# Patient Record
Sex: Female | Born: 1985 | Race: Asian | Hispanic: No | Marital: Married | Smoking: Never smoker
Health system: Southern US, Community
[De-identification: ages and names within clinical notes are randomized; demographics above are authoritative.]

## PROBLEM LIST (undated history)

## (undated) ENCOUNTER — Inpatient Hospital Stay (HOSPITAL_COMMUNITY): Payer: Self-pay

## (undated) DIAGNOSIS — Z789 Other specified health status: Secondary | ICD-10-CM

## (undated) HISTORY — PX: NO PAST SURGERIES: SHX2092

---

## 2013-06-07 LAB — OB RESULTS CONSOLE ABO/RH: RH Type: POSITIVE

## 2013-06-07 LAB — OB RESULTS CONSOLE HIV ANTIBODY (ROUTINE TESTING): HIV: NONREACTIVE

## 2013-06-07 LAB — OB RESULTS CONSOLE GC/CHLAMYDIA
Chlamydia: NEGATIVE
Gonorrhea: NEGATIVE

## 2013-06-07 LAB — OB RESULTS CONSOLE RUBELLA ANTIBODY, IGM: Rubella: IMMUNE

## 2013-06-07 LAB — OB RESULTS CONSOLE RPR: RPR: NONREACTIVE

## 2013-12-04 ENCOUNTER — Observation Stay (HOSPITAL_COMMUNITY)
Admission: AD | Admit: 2013-12-04 | Discharge: 2013-12-05 | Disposition: A | Discharge status: Home / Self Care | Payer: Medicaid Other | Source: Ambulatory Visit | Attending: Obstetrics and Gynecology | Admitting: Obstetrics and Gynecology

## 2013-12-04 ENCOUNTER — Encounter (HOSPITAL_COMMUNITY): Payer: Self-pay | Admitting: *Deleted

## 2013-12-04 DIAGNOSIS — R059 Cough, unspecified: Secondary | ICD-10-CM | POA: Insufficient documentation

## 2013-12-04 DIAGNOSIS — J029 Acute pharyngitis, unspecified: Secondary | ICD-10-CM | POA: Insufficient documentation

## 2013-12-04 DIAGNOSIS — O4700 False labor before 37 completed weeks of gestation, unspecified trimester: Secondary | ICD-10-CM | POA: Insufficient documentation

## 2013-12-04 DIAGNOSIS — O99891 Other specified diseases and conditions complicating pregnancy: Secondary | ICD-10-CM | POA: Insufficient documentation

## 2013-12-04 DIAGNOSIS — IMO0002 Reserved for concepts with insufficient information to code with codable children: Secondary | ICD-10-CM

## 2013-12-04 DIAGNOSIS — R6889 Other general symptoms and signs: Secondary | ICD-10-CM | POA: Insufficient documentation

## 2013-12-04 DIAGNOSIS — O479 False labor, unspecified: Secondary | ICD-10-CM

## 2013-12-04 DIAGNOSIS — O368390 Maternal care for abnormalities of the fetal heart rate or rhythm, unspecified trimester, not applicable or unspecified: Principal | ICD-10-CM | POA: Insufficient documentation

## 2013-12-04 DIAGNOSIS — R05 Cough: Secondary | ICD-10-CM | POA: Insufficient documentation

## 2013-12-04 HISTORY — DX: Other specified health status: Z78.9

## 2013-12-04 LAB — URINALYSIS, ROUTINE W REFLEX MICROSCOPIC
Glucose, UA: NEGATIVE mg/dL
Ketones, ur: NEGATIVE mg/dL
Leukocytes, UA: NEGATIVE
pH: 6.5 (ref 5.0–8.0)

## 2013-12-04 MED ORDER — LACTATED RINGERS IV SOLN
INTRAVENOUS | Status: DC
  Administered 2013-12-04 – 2013-12-05 (×3): via INTRAVENOUS

## 2013-12-04 MED ORDER — LACTATED RINGERS IV BOLUS (SEPSIS)
1000.0000 mL | Freq: Once | INTRAVENOUS | Status: AC
  Administered 2013-12-04: 1000 mL via INTRAVENOUS

## 2013-12-04 NOTE — MAU Note (Signed)
Pt started this morning with vomiting X1, fever 102.2, sore throat, slight headache and dry cough. Denies any vag bleeding or leaking. Reports fetal movement.

## 2013-12-04 NOTE — MAU Provider Note (Signed)
History     CSN: [id number]  Arrival date and time: 12/04/13 2006   First Provider Initiated Contact with Patient 12/04/13 2100      No chief complaint on file.  HPI Ms. Amy Morse is a 27 y.o. G1P0 at [redacted]w[redacted]d who presents to MAU today with flu-like symptoms. The patient states she has had very mild sore throat, cough and headache today. She also notes a temperature of 102.1F at home earlier today. She does endorse some weakness and contractions at this time. She denies body aches or sinus congestion. She also notes less fetal movement than usual today.   OB History   Grav Para Term Preterm Abortions TAB SAB Ect Mult Living   1         0      Past Medical History  Diagnosis Date  . Medical history non-contributory     Past Surgical History  Procedure Laterality Date  . No past surgeries      Family History  Problem Relation Age of Onset  . Depression Mother   . Hypertension Father   . Diabetes Father     History  Substance Use Topics  . Smoking status: Never Smoker   . Smokeless tobacco: Not on file  . Alcohol Use: No    Allergies: No Known Allergies  Prescriptions prior to admission  Medication Sig Dispense Refill  . Prenatal Vit-Fe Fumarate-FA (PRENATAL MULTIVITAMIN) TABS tablet Take 1 tablet by mouth daily at 12 noon.        Review of Systems  Constitutional: Positive for fever and malaise/fatigue.  Gastrointestinal: Positive for nausea, vomiting and abdominal pain.   Physical Exam   Blood pressure 108/61, pulse 118, temperature 98.8 F (37.1 C), temperature source Oral, resp. rate 16, height 5\' 1"  (1.549 m), weight 130 lb (58.968 kg), last menstrual period 03/25/2013.  Physical Exam  Constitutional: She is oriented to person, place, and time. She appears well-developed and well-nourished. No distress.  HENT:  Head: Normocephalic and atraumatic.  Cardiovascular: Normal rate, regular rhythm and normal heart sounds.   Respiratory: Effort normal and  breath sounds normal. No respiratory distress. She has no wheezes.  GI: Soft. She exhibits no distension and no mass. There is no tenderness. There is no rebound and no guarding.  Neurological: She is alert and oriented to person, place, and time.  Skin: Skin is warm and dry. No erythema.  Psychiatric: She has a normal mood and affect.  Dilation: 2.5 Effacement (%): 80 Cervical Position: Posterior Station: -3 Presentation: Vertex Exam by:: Elie Confer RN  Results for orders placed during the hospital encounter of 12/04/13 (from the past 24 hour(s))  URINALYSIS, ROUTINE W REFLEX MICROSCOPIC     Status: None   Collection Time    12/04/13  8:35 PM      Result Value Range   Color, Urine YELLOW  YELLOW   APPearance CLEAR  CLEAR   Specific Gravity, Urine 1.020  1.005 - 1.030   pH 6.5  5.0 - 8.0   Glucose, UA NEGATIVE  NEGATIVE mg/dL   Hgb urine dipstick NEGATIVE  NEGATIVE   Bilirubin Urine NEGATIVE  NEGATIVE   Ketones, ur NEGATIVE  NEGATIVE mg/dL   Protein, ur NEGATIVE  NEGATIVE mg/dL   Urobilinogen, UA 0.2  0.0 - 1.0 mg/dL   Nitrite NEGATIVE  NEGATIVE   Leukocytes, UA NEGATIVE  NEGATIVE    Fetal Monitoring: Baseline: 145 bpm, moderate variability, + accelerations, no decelerations Contractions: q 2-3 minutes, mild MAU  Course  Procedures None  MDM Discussed patient with Dr. Ellyn Hack including fetal tachycardia, contractions and variable decelerations Continue to monitor in MAU x 1 hour and update Dr. Ellyn Hack. Fetal tachycardia continues with occasional variables. Cervix has changed from 1.5/60/-3 to 2.5/80/-3 Discussed with Dr. Ellyn Hack. Admit to Antenatal for 23 hour observation with continuous monitoring, clear liquid diet and recheck cervix in 1 hour GBS unknown. If patient's cervix changes, RN instructed to call Dr. Ellyn Hack for antibiotic orders because of unknown GBS status.   Assessment and Plan  A: Uterine contractions Fetal tachycardia  P: Admit to Antenatal under  observation status for additional monitoring  Freddi Starr, PA-C  12/04/2013, 9:00 PM

## 2013-12-05 ENCOUNTER — Observation Stay (HOSPITAL_COMMUNITY): Payer: Medicaid Other

## 2013-12-05 ENCOUNTER — Encounter (HOSPITAL_COMMUNITY): Payer: Self-pay

## 2013-12-05 DIAGNOSIS — IMO0002 Reserved for concepts with insufficient information to code with codable children: Secondary | ICD-10-CM | POA: Diagnosis present

## 2013-12-05 MED ORDER — DOCUSATE SODIUM 100 MG PO CAPS: 100.0000 mg | ORAL_CAPSULE | Freq: Every day | ORAL | Status: DC

## 2013-12-05 MED ORDER — ACETAMINOPHEN 325 MG PO TABS: 650.0000 mg | ORAL_TABLET | ORAL | Status: DC | PRN

## 2013-12-05 MED ORDER — PRENATAL MULTIVITAMIN CH: 1.0000 | ORAL_TABLET | Freq: Every day | ORAL | Status: DC

## 2013-12-05 MED ORDER — MENTHOL 3 MG MT LOZG
1.0000 | LOZENGE | OROMUCOSAL | Status: DC | PRN
Start: 2013-12-05 — End: 2013-12-05
  Administered 2013-12-05: 3 mg via ORAL
  Filled 2013-12-05: qty 9

## 2013-12-05 MED ORDER — CALCIUM CARBONATE ANTACID 500 MG PO CHEW: 2.0000 | CHEWABLE_TABLET | ORAL | Status: DC | PRN

## 2013-12-05 MED ORDER — ZOLPIDEM TARTRATE 5 MG PO TABS
5.0000 mg | ORAL_TABLET | Freq: Every evening | ORAL | Status: DC | PRN
  Administered 2013-12-05: 5 mg via ORAL
  Filled 2013-12-05: qty 1

## 2013-12-05 MED ORDER — ACETAMINOPHEN 500 MG PO TABS
1000.0000 mg | ORAL_TABLET | Freq: Four times a day (QID) | ORAL | Status: AC | PRN
  Administered 2013-12-05: 1000 mg via ORAL
  Filled 2013-12-05: qty 2

## 2013-12-05 NOTE — Discharge Summary (Signed)
Obstetric Discharge Summary Reason for Admission: cervical change, fetal tachycardia Prenatal Procedures: NST and ultrasound Intrapartum Procedures: N/A Postpartum Procedures: none Complications-Operative and Postpartum: none No results found for this basename: hgb, hct    Physical Exam:  General: alert and no distress   Discharge Diagnoses: Term pregnancy  Discharge Information: Date: 12/05/2013 Activity: unrestricted Diet: routine Medications: PNV Condition: stable Instructions: refer to practice specific booklet Discharge to: home Follow-up Information   Follow up with BOVARD,Jadeyn Hargett, MD In 3 days. (As scheduled)    Specialty:  Obstetrics and Gynecology   Contact information:   [address] [place] [address] [phone]        BOVARD,Donnalyn Juran 12/05/2013, 7:47 AM

## 2013-12-05 NOTE — H&P (Signed)
  Pt admitted after presentation to MAU with questionable cervical change.  Pt presented with ? Flu sx's.  Temperature at home = 102.2, on recheck 98.  Some vague sx's.  Sick contact is her mother.  Moother had rapid flu negative.  Please see NP note for complete H&P

## 2013-12-05 NOTE — Progress Notes (Signed)
Patient ID: Amy Morse, female   DOB: 1986/04/01, 27 y.o.   MRN: [id number]  Feeling "sick" - has cough and runny nose.  Recent Tm = 100.5.  Not feeling baby move as well.  No LOF, no VB, occ ctx, some are uncomfortable.  Tm = 100.5 VSS gen NAD FHTs 150's, category 1, occ late decel toco q 4-68min  SVE unchanged per RN  Will obtain BPP and d/c to home.  To f/u this week for appt as scheduled.  Given OTC med and rest advice.

## 2013-12-06 ENCOUNTER — Inpatient Hospital Stay (HOSPITAL_COMMUNITY)
Admission: AD | Admit: 2013-12-06 | Discharge: 2013-12-06 | Disposition: A | Discharge status: Home / Self Care | Payer: Medicaid Other | Source: Ambulatory Visit | Attending: Obstetrics and Gynecology | Admitting: Obstetrics and Gynecology

## 2013-12-06 DIAGNOSIS — R059 Cough, unspecified: Secondary | ICD-10-CM | POA: Insufficient documentation

## 2013-12-06 DIAGNOSIS — R509 Fever, unspecified: Secondary | ICD-10-CM | POA: Insufficient documentation

## 2013-12-06 DIAGNOSIS — O368390 Maternal care for abnormalities of the fetal heart rate or rhythm, unspecified trimester, not applicable or unspecified: Secondary | ICD-10-CM

## 2013-12-06 DIAGNOSIS — R05 Cough: Secondary | ICD-10-CM | POA: Insufficient documentation

## 2013-12-06 DIAGNOSIS — J111 Influenza due to unidentified influenza virus with other respiratory manifestations: Secondary | ICD-10-CM | POA: Insufficient documentation

## 2013-12-06 DIAGNOSIS — O4700 False labor before 37 completed weeks of gestation, unspecified trimester: Secondary | ICD-10-CM | POA: Insufficient documentation

## 2013-12-06 DIAGNOSIS — J101 Influenza due to other identified influenza virus with other respiratory manifestations: Secondary | ICD-10-CM

## 2013-12-06 DIAGNOSIS — O99891 Other specified diseases and conditions complicating pregnancy: Secondary | ICD-10-CM | POA: Insufficient documentation

## 2013-12-06 LAB — INFLUENZA PANEL BY PCR (TYPE A & B)
H1N1 flu by pcr: NOT DETECTED
Influenza A By PCR: POSITIVE — AB
Influenza B By PCR: NEGATIVE

## 2013-12-06 LAB — OB RESULTS CONSOLE GBS: GBS: NEGATIVE

## 2013-12-06 MED ORDER — ACETAMINOPHEN 500 MG PO TABS
1000.0000 mg | ORAL_TABLET | Freq: Once | ORAL | Status: DC
  Filled 2013-12-06: qty 2

## 2013-12-06 MED ORDER — OSELTAMIVIR PHOSPHATE 75 MG PO CAPS: 75.0000 mg | ORAL_CAPSULE | Freq: Two times a day (BID) | ORAL | Status: DC

## 2013-12-06 MED ORDER — OSELTAMIVIR PHOSPHATE 75 MG PO CAPS
75.0000 mg | ORAL_CAPSULE | Freq: Once | ORAL | Status: AC
  Administered 2013-12-06: 75 mg via ORAL
  Filled 2013-12-06: qty 1

## 2013-12-06 NOTE — MAU Note (Signed)
Flu test obtained

## 2013-12-06 NOTE — MAU Provider Note (Signed)
  History     CSN: [id number]  Arrival date and time: 12/06/13 6962   First Provider Initiated Contact with Patient 12/06/13 [phone]      Chief Complaint  Patient presents with  . Influenza   HPI 27 y.o. G1P0 at [redacted]w[redacted]d with cough, sore throat, intermittent fever x 2 days. Was admitted for observation overnight on 12/29 d/t decreased fetal movement, BPP 8/8 at that time, was sent home with instructions for OTC meds and rest. Has been taking tylenol and "cough medicine" at home, still not feeling well. States temp was 100.5 last night.   Past Medical History  Diagnosis Date  . Medical history non-contributory     Past Surgical History  Procedure Laterality Date  . No past surgeries      Family History  Problem Relation Age of Onset  . Depression Mother   . Hypertension Father   . Diabetes Father     History  Substance Use Topics  . Smoking status: Never Smoker   . Smokeless tobacco: Never Used  . Alcohol Use: No    Allergies: No Known Allergies  Prescriptions prior to admission  Medication Sig Dispense Refill  . Prenatal Vit-Fe Fumarate-FA (PRENATAL MULTIVITAMIN) TABS tablet Take 1 tablet by mouth daily at 12 noon.        Review of Systems  Constitutional: Positive for fever, chills and malaise/fatigue.  HENT: Positive for sore throat.   Respiratory: Positive for cough.   Cardiovascular: Negative.   Gastrointestinal: Negative for nausea, vomiting, abdominal pain, diarrhea and constipation.  Genitourinary: Negative for dysuria, urgency, frequency, hematuria and flank pain.       Negative for vaginal bleeding, cramping/contractions  Musculoskeletal: Negative.   Neurological: Negative.   Psychiatric/Behavioral: Negative.    Physical Exam   Blood pressure 110/74, pulse 111, temperature 98.4 F (36.9 C), temperature source Oral, resp. rate 20, last menstrual period 03/25/2013.  Physical Exam  Nursing note and vitals reviewed. Constitutional: She is oriented to  person, place, and time. She appears well-developed and well-nourished. No distress.  Neck: Normal range of motion. Neck supple.  Cardiovascular: Normal rate, regular rhythm and normal heart sounds.   Respiratory: Effort normal and breath sounds normal. No respiratory distress.  GI: Soft. There is no tenderness.  Lymphadenopathy:    She has no cervical adenopathy.  Neurological: She is alert and oriented to person, place, and time.  Skin: Skin is warm and dry.  Psychiatric: She has a normal mood and affect.   EFM reactive, irreg UCs MAU Course  Procedures  Flu PCR collected In view of pt's sx of flu for 2 days- Tamiflu ordered Discussed with Dr. Peterson Lombard had reactive NST but had  variable on FHR @8 :45 am- will watch for another 15 min to equal another hr of observation- if no variables, Pt may go home. Pt had recent BPP 8/8 Results for orders placed during the hospital encounter of 12/06/13 (from the past 24 hour(s))  INFLUENZA PANEL BY PCR     Status: Abnormal   Collection Time    12/06/13  7:30 AM      Result Value Range   Influenza A By PCR POSITIVE (*) NEGATIVE   Influenza B By PCR NEGATIVE  NEGATIVE   H1N1 flu by pcr NOT DETECTED  NOT DETECTED   Assessment and Plan  Care assumed by Pamelia Hoit, NP  Influenza A in pregnancy- Tamiflu BIDfor 5 days F/u with Dr. Dell Ponto 12/06/2013, 7:48 AM

## 2013-12-27 ENCOUNTER — Encounter (HOSPITAL_COMMUNITY): Payer: Medicaid Other | Admitting: Anesthesiology

## 2013-12-27 ENCOUNTER — Inpatient Hospital Stay (HOSPITAL_COMMUNITY)
Admission: AD | Admit: 2013-12-27 | Discharge: 2013-12-29 | DRG: 775 | Disposition: A | Discharge status: Home / Self Care | Payer: Medicaid Other | Source: Ambulatory Visit | Attending: Obstetrics and Gynecology | Admitting: Obstetrics and Gynecology

## 2013-12-27 ENCOUNTER — Encounter (HOSPITAL_COMMUNITY): Payer: Self-pay | Admitting: *Deleted

## 2013-12-27 ENCOUNTER — Inpatient Hospital Stay (HOSPITAL_COMMUNITY): Payer: Medicaid Other | Admitting: Anesthesiology

## 2013-12-27 DIAGNOSIS — O4290 Premature rupture of membranes, unspecified as to length of time between rupture and onset of labor, unspecified weeks of gestation: Secondary | ICD-10-CM

## 2013-12-27 LAB — CBC
HEMATOCRIT: 34.4 % — AB (ref 36.0–46.0)
Hemoglobin: 11.7 g/dL — ABNORMAL LOW (ref 12.0–15.0)
MCH: 29.8 pg (ref 26.0–34.0)
MCHC: 34 g/dL (ref 30.0–36.0)
MCV: 87.8 fL (ref 78.0–100.0)
PLATELETS: 197 10*3/uL (ref 150–400)
RBC: 3.92 MIL/uL (ref 3.87–5.11)
RDW: 13.3 % (ref 11.5–15.5)
WBC: 16.8 10*3/uL — AB (ref 4.0–10.5)

## 2013-12-27 LAB — TYPE AND SCREEN
ABO/RH(D): O POS
Antibody Screen: NEGATIVE

## 2013-12-27 LAB — RPR: RPR Ser Ql: NONREACTIVE

## 2013-12-27 LAB — ABO/RH: ABO/RH(D): O POS

## 2013-12-27 LAB — POCT FERN TEST: POCT Fern Test: POSITIVE

## 2013-12-27 MED ORDER — LACTATED RINGERS IV SOLN
500.0000 mL | INTRAVENOUS | Status: DC | PRN
  Administered 2013-12-27 (×3): 500 mL via INTRAVENOUS

## 2013-12-27 MED ORDER — IBUPROFEN 600 MG PO TABS
600.0000 mg | ORAL_TABLET | Freq: Four times a day (QID) | ORAL | Status: DC | PRN
Start: 2013-12-27 — End: 2013-12-27

## 2013-12-27 MED ORDER — FENTANYL 2.5 MCG/ML BUPIVACAINE 1/10 % EPIDURAL INFUSION (WH - ANES)
14.0000 mL/h | INTRAMUSCULAR | Status: DC | PRN
  Filled 2013-12-27: qty 125

## 2013-12-27 MED ORDER — LACTATED RINGERS IV SOLN: INTRAVENOUS | Status: AC

## 2013-12-27 MED ORDER — PRENATAL MULTIVITAMIN CH: 1.0000 | ORAL_TABLET | Freq: Every day | ORAL | Status: DC

## 2013-12-27 MED ORDER — OXYTOCIN 40 UNITS IN LACTATED RINGERS INFUSION - SIMPLE MED
1.0000 m[IU]/min | INTRAVENOUS | Status: DC
  Administered 2013-12-27: 1 m[IU]/min via INTRAVENOUS
  Filled 2013-12-27: qty 1000

## 2013-12-27 MED ORDER — LACTATED RINGERS IV SOLN
INTRAVENOUS | Status: DC
  Administered 2013-12-27 (×3): via INTRAVENOUS

## 2013-12-27 MED ORDER — WITCH HAZEL-GLYCERIN EX PADS: 1.0000 "application " | MEDICATED_PAD | CUTANEOUS | Status: DC | PRN

## 2013-12-27 MED ORDER — OXYTOCIN 40 UNITS IN LACTATED RINGERS INFUSION - SIMPLE MED
62.5000 mL/h | INTRAVENOUS | Status: DC
  Administered 2013-12-27: 62.5 mL/h via INTRAVENOUS

## 2013-12-27 MED ORDER — TETANUS-DIPHTH-ACELL PERTUSSIS 5-2.5-18.5 LF-MCG/0.5 IM SUSP: 0.5000 mL | Freq: Once | INTRAMUSCULAR | Status: DC

## 2013-12-27 MED ORDER — OXYCODONE-ACETAMINOPHEN 5-325 MG PO TABS: 1.0000 | ORAL_TABLET | ORAL | Status: DC | PRN

## 2013-12-27 MED ORDER — ZOLPIDEM TARTRATE 5 MG PO TABS: 5.0000 mg | ORAL_TABLET | Freq: Every evening | ORAL | Status: DC | PRN

## 2013-12-27 MED ORDER — CITRIC ACID-SODIUM CITRATE 334-500 MG/5ML PO SOLN: 30.0000 mL | ORAL | Status: DC | PRN

## 2013-12-27 MED ORDER — LANOLIN HYDROUS EX OINT: TOPICAL_OINTMENT | CUTANEOUS | Status: DC | PRN

## 2013-12-27 MED ORDER — DIBUCAINE 1 % RE OINT: 1.0000 "application " | TOPICAL_OINTMENT | RECTAL | Status: DC | PRN

## 2013-12-27 MED ORDER — OXYTOCIN 40 UNITS IN LACTATED RINGERS INFUSION - SIMPLE MED: 62.5000 mL/h | INTRAVENOUS | Status: AC | PRN

## 2013-12-27 MED ORDER — IBUPROFEN 600 MG PO TABS
600.0000 mg | ORAL_TABLET | Freq: Four times a day (QID) | ORAL | Status: DC
  Administered 2013-12-28 – 2013-12-29 (×6): 600 mg via ORAL
  Filled 2013-12-27 (×6): qty 1

## 2013-12-27 MED ORDER — ONDANSETRON HCL 4 MG/2ML IJ SOLN: 4.0000 mg | Freq: Four times a day (QID) | INTRAMUSCULAR | Status: DC | PRN

## 2013-12-27 MED ORDER — DIPHENHYDRAMINE HCL 25 MG PO CAPS: 25.0000 mg | ORAL_CAPSULE | Freq: Four times a day (QID) | ORAL | Status: DC | PRN

## 2013-12-27 MED ORDER — LIDOCAINE HCL (PF) 1 % IJ SOLN
INTRAMUSCULAR | Status: DC | PRN
  Administered 2013-12-27 (×2): 4 mL

## 2013-12-27 MED ORDER — ACETAMINOPHEN 325 MG PO TABS: 650.0000 mg | ORAL_TABLET | ORAL | Status: DC | PRN

## 2013-12-27 MED ORDER — PRENATAL MULTIVITAMIN CH
1.0000 | ORAL_TABLET | Freq: Every day | ORAL | Status: DC
  Administered 2013-12-28 – 2013-12-29 (×2): 1 via ORAL
  Filled 2013-12-27 (×2): qty 1

## 2013-12-27 MED ORDER — MEASLES, MUMPS & RUBELLA VAC ~~LOC~~ INJ: 0.5000 mL | INJECTION | Freq: Once | SUBCUTANEOUS | Status: DC

## 2013-12-27 MED ORDER — SENNOSIDES-DOCUSATE SODIUM 8.6-50 MG PO TABS
2.0000 | ORAL_TABLET | ORAL | Status: DC
  Administered 2013-12-28 (×2): 2 via ORAL
  Filled 2013-12-27 (×2): qty 2

## 2013-12-27 MED ORDER — ONDANSETRON HCL 4 MG PO TABS: 4.0000 mg | ORAL_TABLET | ORAL | Status: DC | PRN

## 2013-12-27 MED ORDER — OXYTOCIN BOLUS FROM INFUSION: 500.0000 mL | INTRAVENOUS | Status: DC

## 2013-12-27 MED ORDER — EPHEDRINE 5 MG/ML INJ
10.0000 mg | INTRAVENOUS | Status: DC | PRN
  Filled 2013-12-27: qty 2

## 2013-12-27 MED ORDER — PHENYLEPHRINE 40 MCG/ML (10ML) SYRINGE FOR IV PUSH (FOR BLOOD PRESSURE SUPPORT)
80.0000 ug | PREFILLED_SYRINGE | INTRAVENOUS | Status: DC | PRN
  Filled 2013-12-27: qty 10
  Filled 2013-12-27: qty 2

## 2013-12-27 MED ORDER — PHENYLEPHRINE 40 MCG/ML (10ML) SYRINGE FOR IV PUSH (FOR BLOOD PRESSURE SUPPORT)
80.0000 ug | PREFILLED_SYRINGE | INTRAVENOUS | Status: DC | PRN
  Filled 2013-12-27: qty 2

## 2013-12-27 MED ORDER — SIMETHICONE 80 MG PO CHEW: 80.0000 mg | CHEWABLE_TABLET | ORAL | Status: DC | PRN

## 2013-12-27 MED ORDER — OXYCODONE-ACETAMINOPHEN 5-325 MG PO TABS
1.0000 | ORAL_TABLET | ORAL | Status: DC | PRN
  Administered 2013-12-28: 1 via ORAL
  Filled 2013-12-27: qty 1

## 2013-12-27 MED ORDER — FENTANYL 2.5 MCG/ML BUPIVACAINE 1/10 % EPIDURAL INFUSION (WH - ANES)
INTRAMUSCULAR | Status: DC | PRN
  Administered 2013-12-27: 14 mL/h via EPIDURAL

## 2013-12-27 MED ORDER — DIPHENHYDRAMINE HCL 50 MG/ML IJ SOLN: 12.5000 mg | INTRAMUSCULAR | Status: DC | PRN

## 2013-12-27 MED ORDER — LACTATED RINGERS IV SOLN
500.0000 mL | Freq: Once | INTRAVENOUS | Status: AC
  Administered 2013-12-27: 12:00:00 via INTRAVENOUS

## 2013-12-27 MED ORDER — EPHEDRINE 5 MG/ML INJ
10.0000 mg | INTRAVENOUS | Status: DC | PRN
  Filled 2013-12-27: qty 4
  Filled 2013-12-27: qty 2

## 2013-12-27 MED ORDER — TERBUTALINE SULFATE 1 MG/ML IJ SOLN: 0.2500 mg | Freq: Once | INTRAMUSCULAR | Status: DC | PRN

## 2013-12-27 MED ORDER — LIDOCAINE HCL (PF) 1 % IJ SOLN
30.0000 mL | INTRAMUSCULAR | Status: DC | PRN
  Administered 2013-12-27: 30 mL via SUBCUTANEOUS
  Filled 2013-12-27 (×2): qty 30

## 2013-12-27 MED ORDER — ONDANSETRON HCL 4 MG/2ML IJ SOLN: 4.0000 mg | INTRAMUSCULAR | Status: DC | PRN

## 2013-12-27 MED ORDER — BENZOCAINE-MENTHOL 20-0.5 % EX AERO
1.0000 "application " | INHALATION_SPRAY | CUTANEOUS | Status: DC | PRN
  Filled 2013-12-27: qty 56

## 2013-12-27 NOTE — Progress Notes (Signed)
Patient ID: Amy CheshireHarpreet Morse, female   DOB: 06/26/1986, 28 y.o.   MRN: [id number] At 1:43 PM after turning onto her back to drink some fluid the FHR dropped to 90-100 for 7 minutes. It responded to turning onto her left side , stopping the pitocin and O2 by mask

## 2013-12-27 NOTE — Anesthesia Procedure Notes (Signed)
Epidural Patient location during procedure: OB Start time: 12/27/2013 11:50 AM  Staffing Anesthesiologist: Janey Petron A. Performed by: anesthesiologist   Preanesthetic Checklist Completed: patient identified, site marked, surgical consent, pre-op evaluation, timeout performed, IV checked, risks and benefits discussed and monitors and equipment checked  Epidural Patient position: sitting Prep: site prepped and draped and DuraPrep Patient monitoring: continuous pulse ox and blood pressure Approach: midline Injection technique: LOR air  Needle:  Needle type: Tuohy  Needle gauge: 17 G Needle length: 9 cm and 9 Needle insertion depth: 5 cm cm Catheter type: closed end flexible Catheter size: 19 Gauge Catheter at skin depth: 10 cm Test dose: negative and Other  Assessment Events: blood not aspirated, injection not painful, no injection resistance, negative IV test and no paresthesia  Additional Notes Patient identified. Risks and benefits discussed including failed block, incomplete  Pain control, post dural puncture headache, nerve damage, paralysis, blood pressure Changes, nausea, vomiting, reactions to medications-both toxic and allergic and post Partum back pain. All questions were answered. Patient expressed understanding and wished to proceed. Sterile technique was used throughout procedure. Epidural site was Dressed with sterile barrier dressing. No paresthesias, signs of intravascular injection Or signs of intrathecal spread were encountered. Attempt x 3 Difficult due to poor position. Patient was more comfortable after the epidural was dosed. Please see RN's note for documentation of vital signs and FHR which are stable.

## 2013-12-27 NOTE — MAU Note (Signed)
Pt presents with complaints of contractions that started around 430 this morning. Pt denies any bleeding or LOF states some spotting. States baby is active.

## 2013-12-27 NOTE — Anesthesia Preprocedure Evaluation (Signed)

## 2013-12-27 NOTE — Progress Notes (Signed)
Dr Ambrose MantleHenley notified of pt's VE, contraction pattern, FHR and Membrane status.

## 2013-12-27 NOTE — Progress Notes (Signed)
Patient ID: Amy Morse, female   DOB: 03/10/1986, 28 y.o.   MRN: [id number] Delivery note:  The pt progressed to full dilatation and had SROM with clear fluid. She made slow but steady progress and delivered spontaneously LOA over a second degree ML laceration and a laceration between the urethra and the clitoris a living female infant Apgars 9 and 9 at 1 and 5 minutes. The placenta was intact and the uterus was normal. Lacerations were repaired with 3-0 vicryl under local block. EBL 400 cc's.

## 2013-12-27 NOTE — H&P (Signed)
NAMClovis Morse:  Morse, Amy               ACCOUNT NO.:  [id number]  MEDICAL RECORD NO.:  [id number]  LOCATION:  9169                          [facility]ILITY:  [facility]  PHYSICIAN:  Malachi Prohomas F. Ambrose MantleHenley, M.D. DATE OF BIRTH:  April 05, 1986  DATE OF ADMISSION:  12/27/2013 DATE OF DISCHARGE:                             HISTORY & PHYSICAL   HISTORY OF PRESENT ILLNESS:  This is a 28 year old [place] female, para 0, gravida 1, EDC January 06, 2014, who is admitted in early labor. This patient's [facility] April 01, 2013[facility]  [facility] on June 17 at 7 weeks [facility]. Ultrasound on June 27 at 8 weeks and 3 days [facility]. Final EDC chosen by Dr. Ellyn HackBovard is January 06, 2014.  Her blood group and type was O positive with negative antibody.  Pap smear normal.  Rubella immune.  RPR nonreactive.  Urine culture negative.  Hepatitis B surface antigen negative.  HIV negative.  GC and Chlamydia negative.  Cystic fibrosis negative.  First trimester screen negative.  AFP negative.  One hour Glucola 123.  Group B strep negative.  The patient began her prenatal course at our office at 9 weeks and 4 days.  She had no significant problems during the pregnancy.  At 31 weeks and 5 days, she measured a little bit low and ultrasound was checked for growth. Ultrasound suggested normal amniotic fluid index and good growth.  On December 04, 2013, the patient had a fever to 102.2, some cough, and nausea and vomiting.  She was started on Tamiflu because of a positive flu test.  On December 08, 2013, the patient's cervix was 2 to 3 cm.  She came to the hospital today, and on evaluation was found to be 4 cm, 100% effaced, and was admitted.  PAST MEDICAL HISTORY:  Reveals no significant medical illnesses.  She did have a fracture of her right shoulder in the 4th grade.  ALLERGIES:  She has seasonal allergies and allergic rhinitis.  PAST SURGICAL HISTORY:  None  recorded.  SOCIAL HISTORY:  Does not smoke or drink.  Denied illicit drugs.  12th grade education.  Works as a Firefightertravel agent.  FAMILY HISTORY:  Father with high blood pressure and diabetes.  Mother with depression.  PHYSICAL EXAMINATION:  VITAL SIGNS:  On admission, temperature 98, pulse 94, respirations 18, blood pressure 112/79. HEART:  Normal size and sounds.  No murmurs. LUNGS:  Clear to auscultation. GU:  At her last prenatal visit in our office, fundal height was 36.5 cm on December 19, 2013.  The patient has received an epidural, and the nurse has examined her within the last 15 minutes.  The cervix was 5 cm, a 100% vertex at 0 station.  ADMITTING IMPRESSION:  Intrauterine pregnancy at 38 weeks and 4 days, in active labor.  The patient has no significant medical complications during the pregnancy except for a bout of flu in the last month.  She will be observed for progress in labor.     Malachi Prohomas F. Ambrose MantleHenley, M.D.     TFH/MEDQ  D:  12/27/2013  T:  12/27/2013  Job:  [id number] 

## 2013-12-28 LAB — CBC
HCT: 29.6 % — ABNORMAL LOW (ref 36.0–46.0)
Hemoglobin: 10.1 g/dL — ABNORMAL LOW (ref 12.0–15.0)
MCH: 29.7 pg (ref 26.0–34.0)
MCHC: 34.1 g/dL (ref 30.0–36.0)
MCV: 87.1 fL (ref 78.0–100.0)
PLATELETS: 156 10*3/uL (ref 150–400)
RBC: 3.4 MIL/uL — ABNORMAL LOW (ref 3.87–5.11)
RDW: 13.2 % (ref 11.5–15.5)
WBC: 18.3 10*3/uL — ABNORMAL HIGH (ref 4.0–10.5)

## 2013-12-28 NOTE — Anesthesia Postprocedure Evaluation (Signed)
Anesthesia Post Note  Patient: Amy Morse  Procedure(s) Performed: * No procedures listed *  Anesthesia type: Epidural  Patient location: Mother/Baby  Post pain: Pain level controlled  Post assessment: Post-op Vital signs reviewed  Last Vitals:  Filed Vitals:   12/28/13 0545  BP: 76/50  Pulse: 68  Temp: 36.7 C  Resp: 16    Post vital signs: Reviewed  Level of consciousness: awake  Complications: No apparent anesthesia complications

## 2013-12-28 NOTE — Progress Notes (Signed)
Patient ID: Amy CheshireHarpreet Morse, female   DOB: 01/03/1986, 28 y.o.   MRN: [id number] #1 afebrile BP normal No complaints HGB 11.7 to 10.1

## 2013-12-28 NOTE — Lactation Note (Signed)
This note was copied from the chart of Amy Morse. Lactation Consultation Note follow up visit at 26 hours of age.  Previous initial visit short, but [facility] given to mom and she reports being aware of  Services.  Mom reports learning to hold baby closer for a deep latch to prevent soreness.  Mom is able to hand express colostrum and will rub into nipples.  Baby asleep in crib.  Mom is aware of feeding cues and cluster feedings.  Mom to call for assist as needed.   Patient Name: Amy Morse Reason for consult: Follow-up assessment   Maternal Data    Feeding Feeding Type: Breast Fed Length of feed: 10 min  LATCH Score/Interventions Latch: Grasps breast easily, tongue down, lips flanged, rhythmical sucking. Intervention(s): Adjust position  Audible Swallowing: A few with stimulation Intervention(s): Skin to skin  Type of Nipple: Everted at rest and after stimulation  Comfort (Breast/Nipple): Soft / non-tender     Intervention(s): Breastfeeding basics reviewed     Lactation Tools Discussed/Used     Consult Status Consult Status: Follow-up Date: 12/29/13 Follow-up type: In-patient    Amy Morse, Amy Morse Morse, 7:45 PM

## 2013-12-29 MED ORDER — IBUPROFEN 600 MG PO TABS: 600.0000 mg | ORAL_TABLET | Freq: Four times a day (QID) | ORAL | Status: DC | PRN

## 2013-12-29 MED ORDER — OXYCODONE-ACETAMINOPHEN 5-325 MG PO TABS: 1.0000 | ORAL_TABLET | Freq: Four times a day (QID) | ORAL | Status: DC | PRN

## 2013-12-29 NOTE — Progress Notes (Signed)
Have had to continually place baby in the crib and out of the bed while mom is sleeping. Reviewed baby safety information with mom, but she states "baby does not sleep when he is in the crib and it hurts my arms to get up and down out of the bed to get him".  Reminded mom that it is not safe to sleep with baby in the bed and that it increases the risk of SIDS.

## 2013-12-29 NOTE — Discharge Instructions (Signed)
booklet °

## 2013-12-29 NOTE — Progress Notes (Signed)
Patient ID: Amy Morse, female   DOB: 07/16/1986, 28 y.o.   MRN: [id number] #2 afebrile BP normal no problems for d/c

## 2013-12-30 NOTE — Discharge Summary (Signed)
NAMClovis Morse:  Firkus, Darion               ACCOUNT NO.:  [id number]  MEDICAL RECORD NO.:  [id number]  LOCATION:  9106                          [facility]ILITY:  [facility]  PHYSICIAN:  Malachi Prohomas F. Ambrose MantleHenley, M.D. DATE OF BIRTH:  May 24, 1986  DATE OF ADMISSION:  12/27/2013 DATE OF DISCHARGE:  12/29/2013                              DISCHARGE SUMMARY   HISTORY OF PRESENT ILLNESS:  A 28 year old [place] female, para 0, gravida 1, EDC January 06, 2014, admitted in early labor.  Prenatal labs were all normal.  The patient had a rupture of membranes, came to the hospital, was found to be 4 cm dilated.  During labor, the fetal heart rate dropped to 90-100 for 7 minutes, but it responded to turning on the left side and stopping the Pitocin.  The Pitocin was never restarted. She progressed to full dilatation.  She had spontaneous rupture of membranes with clear fluid.  Even though, she had already shown positive on a fern test, she made slow, but steady progress and delivered spontaneously, LOA over a second-degree midline laceration and the laceration between the urethra and the clitoris.  A living female infant, Apgars 9 and nine at 1 and five minutes.  Placenta was intact.  Uterus normal.  Lacerations repaired with 3-0 Vicryl under local block.  Blood loss about 400 mL.  Postpartum, the patient did well and was discharged on the second postpartum day.  Initial hemoglobin 11.7, hematocrit 34.4, white count 16,800, platelet count 197,000.  RPR nonreactive.  Followup hemoglobin 10.1, hematocrit 29.6.  FINAL DIAGNOSIS:  Intrauterine pregnancy at 38-1/2 weeks, delivered vertex.  OPERATIONS:  Spontaneous delivery vertex repair of second-degree midline laceration and laceration between the urethra and the clitoris.  FINAL CONDITION:  Improved.  INSTRUCTIONS:  Include our regular discharge instruction booklet.  The patient is given a copy of our after visit summary.  Prescriptions for Percocet 5/325, 30 tablets, 1 every 6  hours as needed for pain and Motrin 600 mg 30 tablets, 1 every 6 hours as needed for pain.  She is advised to return to the office in 6 weeks for followup examination.     Malachi Prohomas F. Ambrose MantleHenley, M.D.     TFH/MEDQ  D:  12/29/2013  T:  12/30/2013  Job:  [id number]

## 2014-01-02 NOTE — Progress Notes (Signed)
Post discharge chart review completed.  

## 2014-10-08 ENCOUNTER — Encounter (HOSPITAL_COMMUNITY): Payer: Self-pay | Admitting: *Deleted

## 2018-12-07 NOTE — L&D Delivery Note (Signed)
Delivery Note Pt pushed well for 1mins and at 1:35 PM a viable female was delivered via Vaginal, Spontaneous (Presentation:LOA;  ).  APGAR:8, 9, ; weight  pending   Placenta status: delivered intact, schultz, .  Cord:3vc  with the following complications:none .  Cord pH: n/a  Anesthesia: Epidural  Episiotomy: None Lacerations: Periurethral Suture Repair: 4-0 vicryl Est. Blood Loss (mL): 209  Mom to postpartum.  Baby to Couplet care / Skin to Skin  Does not desire circumcision.  Amy Morse 09/06/2019, 1:48 PM

## 2019-01-27 ENCOUNTER — Ambulatory Visit (HOSPITAL_COMMUNITY)
Admission: RE | Admit: 2019-01-27 | Discharge: 2019-01-27 | Disposition: A | Discharge status: Home / Self Care | Payer: BLUE CROSS/BLUE SHIELD | Source: Ambulatory Visit | Attending: Obstetrics and Gynecology | Admitting: Obstetrics and Gynecology

## 2019-01-27 ENCOUNTER — Other Ambulatory Visit: Payer: Self-pay | Admitting: Obstetrics and Gynecology

## 2019-01-27 DIAGNOSIS — O200 Threatened abortion: Secondary | ICD-10-CM | POA: Insufficient documentation

## 2019-02-27 LAB — OB RESULTS CONSOLE RUBELLA ANTIBODY, IGM: Rubella: IMMUNE

## 2019-02-27 LAB — OB RESULTS CONSOLE HIV ANTIBODY (ROUTINE TESTING): HIV: NONREACTIVE

## 2019-02-27 LAB — OB RESULTS CONSOLE GC/CHLAMYDIA
Chlamydia: NEGATIVE
Gonorrhea: NEGATIVE

## 2019-02-27 LAB — OB RESULTS CONSOLE RPR: RPR: NONREACTIVE

## 2019-02-27 LAB — OB RESULTS CONSOLE HEPATITIS B SURFACE ANTIGEN: Hepatitis B Surface Ag: NEGATIVE

## 2019-08-21 LAB — OB RESULTS CONSOLE GBS: GBS: NEGATIVE

## 2019-08-25 ENCOUNTER — Inpatient Hospital Stay (HOSPITAL_COMMUNITY)
Admission: AD | Admit: 2019-08-25 | Discharge: 2019-08-25 | Disposition: A | Discharge status: Home / Self Care | Payer: BC Managed Care – PPO | Attending: Obstetrics and Gynecology | Admitting: Obstetrics and Gynecology

## 2019-08-25 ENCOUNTER — Other Ambulatory Visit: Payer: Self-pay

## 2019-08-25 ENCOUNTER — Encounter (HOSPITAL_COMMUNITY): Payer: Self-pay | Admitting: *Deleted

## 2019-08-25 DIAGNOSIS — O4703 False labor before 37 completed weeks of gestation, third trimester: Secondary | ICD-10-CM

## 2019-08-25 DIAGNOSIS — O471 False labor at or after 37 completed weeks of gestation: Secondary | ICD-10-CM | POA: Diagnosis not present

## 2019-08-25 DIAGNOSIS — O479 False labor, unspecified: Secondary | ICD-10-CM

## 2019-08-25 DIAGNOSIS — Z3A37 37 weeks gestation of pregnancy: Secondary | ICD-10-CM | POA: Diagnosis not present

## 2019-08-25 NOTE — MAU Note (Signed)
.   Amy Morse is a 33 y.o. at [redacted]w[redacted]d here in MAU reporting: contractions since 6am. Denies any vaginal bleeding or LOF  Onset of complaint: 6am Pain score: 6 Vitals:   08/25/19 0932  BP: (!) 105/57  Pulse: 71  Resp: 16  Temp: (!) 97.4 F (36.3 C)  SpO2: 99%     FHT:160 Lab orders placed from triage:

## 2019-08-25 NOTE — Discharge Instructions (Signed)

## 2019-08-25 NOTE — MAU Note (Signed)
I have communicated with Jorje Guild, NP and reviewed vital signs:  Vitals:   08/25/19 0932 08/25/19 1101  BP: (!) 105/57 104/73  Pulse: 71   Resp: 16   Temp: (!) 97.4 F (36.3 C)   SpO2: 99%     Vaginal exam:  Dilation: 1 Effacement (%): Thick Cervical Position: Posterior Station: -3 Exam by:: Ginger Morris RN,   Also reviewed contraction pattern and that non-stress test is reactive.  It has been documented that patient is having irregular contractions with no cervical change over 1 hour not indicating active labor.  Patient denies any other complaints.  Based on this report provider has given order for discharge.  A discharge order and diagnosis entered by a provider.   Labor discharge instructions reviewed with patient.

## 2019-08-25 NOTE — MAU Provider Note (Signed)
S: Patient is here for RN labor evaluation. Strip, vital signs, & chart Reviewed   O:  Vitals:   08/25/19 0932  BP: (!) 105/57  Pulse: 71  Resp: 16  Temp: (!) 97.4 F (36.3 C)  SpO2: 99%   No results found for this or any previous visit (from the past 24 hour(s)).  Dilation: 1 Effacement (%): Thick Cervical Position: Posterior Station: -3 Exam by:: Ginger Morris RN   FHR: 140 bpm, Mod Var, no Decels, 15x15 Accels UC: irregular   A: 1. False labor   2. [redacted] weeks gestation of pregnancy      P:  RN to discharge home in stable condition with return precautions & fetal kick counts  Jorje Guild FNP 10:59 AM

## 2019-09-04 ENCOUNTER — Telehealth (HOSPITAL_COMMUNITY): Payer: Self-pay | Admitting: *Deleted

## 2019-09-04 ENCOUNTER — Encounter (HOSPITAL_COMMUNITY): Payer: Self-pay | Admitting: *Deleted

## 2019-09-04 NOTE — Telephone Encounter (Signed)
Preadmission screen  

## 2019-09-06 ENCOUNTER — Other Ambulatory Visit: Payer: Self-pay

## 2019-09-06 ENCOUNTER — Encounter (HOSPITAL_COMMUNITY): Payer: Self-pay

## 2019-09-06 ENCOUNTER — Other Ambulatory Visit (HOSPITAL_COMMUNITY)
Admission: RE | Admit: 2019-09-06 | Discharge: 2019-09-06 | Disposition: A | Discharge status: Home / Self Care | Payer: BC Managed Care – PPO | Source: Ambulatory Visit | Attending: Obstetrics and Gynecology | Admitting: Obstetrics and Gynecology

## 2019-09-06 ENCOUNTER — Inpatient Hospital Stay (HOSPITAL_COMMUNITY): Payer: BC Managed Care – PPO | Admitting: Anesthesiology

## 2019-09-06 ENCOUNTER — Inpatient Hospital Stay (HOSPITAL_COMMUNITY)
Admission: AD | Admit: 2019-09-06 | Discharge: 2019-09-07 | DRG: 807 | Disposition: A | Discharge status: Home / Self Care | Payer: BC Managed Care – PPO | Attending: Obstetrics and Gynecology | Admitting: Obstetrics and Gynecology

## 2019-09-06 DIAGNOSIS — Z3A38 38 weeks gestation of pregnancy: Secondary | ICD-10-CM

## 2019-09-06 DIAGNOSIS — Z20828 Contact with and (suspected) exposure to other viral communicable diseases: Secondary | ICD-10-CM | POA: Diagnosis present

## 2019-09-06 DIAGNOSIS — O26893 Other specified pregnancy related conditions, third trimester: Secondary | ICD-10-CM | POA: Diagnosis present

## 2019-09-06 LAB — CBC
HCT: 38.1 % (ref 36.0–46.0)
Hemoglobin: 12.8 g/dL (ref 12.0–15.0)
MCH: 32.3 pg (ref 26.0–34.0)
MCHC: 33.6 g/dL (ref 30.0–36.0)
MCV: 96.2 fL (ref 80.0–100.0)
Platelets: 202 10*3/uL (ref 150–400)
RBC: 3.96 MIL/uL (ref 3.87–5.11)
RDW: 13.5 % (ref 11.5–15.5)
WBC: 13.2 10*3/uL — ABNORMAL HIGH (ref 4.0–10.5)
nRBC: 0 % (ref 0.0–0.2)

## 2019-09-06 LAB — TYPE AND SCREEN
ABO/RH(D): O POS
Antibody Screen: NEGATIVE

## 2019-09-06 LAB — POCT FERN TEST: POCT Fern Test: POSITIVE

## 2019-09-06 LAB — ABO/RH: ABO/RH(D): O POS

## 2019-09-06 LAB — SARS CORONAVIRUS 2 BY RT PCR (HOSPITAL ORDER, PERFORMED IN ~~LOC~~ HOSPITAL LAB): SARS Coronavirus 2: NEGATIVE

## 2019-09-06 MED ORDER — ONDANSETRON HCL 4 MG PO TABS: 4.0000 mg | ORAL_TABLET | ORAL | Status: DC | PRN

## 2019-09-06 MED ORDER — IBUPROFEN 600 MG PO TABS
600.0000 mg | ORAL_TABLET | Freq: Four times a day (QID) | ORAL | Status: DC
  Administered 2019-09-06 – 2019-09-07 (×4): 600 mg via ORAL
  Filled 2019-09-06 (×4): qty 1

## 2019-09-06 MED ORDER — BENZOCAINE-MENTHOL 20-0.5 % EX AERO
1.0000 "application " | INHALATION_SPRAY | CUTANEOUS | Status: DC | PRN
  Administered 2019-09-06: 1 via TOPICAL
  Filled 2019-09-06: qty 56

## 2019-09-06 MED ORDER — OXYTOCIN BOLUS FROM INFUSION
500.0000 mL | Freq: Once | INTRAVENOUS | Status: AC
  Administered 2019-09-06: 500 mL via INTRAVENOUS

## 2019-09-06 MED ORDER — LACTATED RINGERS IV SOLN
500.0000 mL | Freq: Once | INTRAVENOUS | Status: AC
  Administered 2019-09-06: 500 mL via INTRAVENOUS

## 2019-09-06 MED ORDER — OXYCODONE HCL 5 MG PO TABS: 10.0000 mg | ORAL_TABLET | ORAL | Status: DC | PRN

## 2019-09-06 MED ORDER — OXYCODONE-ACETAMINOPHEN 5-325 MG PO TABS: 2.0000 | ORAL_TABLET | ORAL | Status: DC | PRN

## 2019-09-06 MED ORDER — LIDOCAINE HCL (PF) 1 % IJ SOLN: 30.0000 mL | INTRAMUSCULAR | Status: DC | PRN

## 2019-09-06 MED ORDER — SIMETHICONE 80 MG PO CHEW: 80.0000 mg | CHEWABLE_TABLET | ORAL | Status: DC | PRN

## 2019-09-06 MED ORDER — LACTATED RINGERS IV SOLN: 500.0000 mL | INTRAVENOUS | Status: DC | PRN

## 2019-09-06 MED ORDER — FLEET ENEMA 7-19 GM/118ML RE ENEM: 1.0000 | ENEMA | RECTAL | Status: DC | PRN

## 2019-09-06 MED ORDER — LACTATED RINGERS IV SOLN
INTRAVENOUS | Status: DC
  Administered 2019-09-06: 125 mL/h via INTRAVENOUS
  Administered 2019-09-06: 11:00:00 via INTRAVENOUS

## 2019-09-06 MED ORDER — DIBUCAINE (PERIANAL) 1 % EX OINT: 1.0000 "application " | TOPICAL_OINTMENT | CUTANEOUS | Status: DC | PRN

## 2019-09-06 MED ORDER — SOD CITRATE-CITRIC ACID 500-334 MG/5ML PO SOLN: 30.0000 mL | ORAL | Status: DC | PRN

## 2019-09-06 MED ORDER — LACTATED RINGERS IV SOLN: 500.0000 mL | Freq: Once | INTRAVENOUS | Status: DC

## 2019-09-06 MED ORDER — PHENYLEPHRINE 40 MCG/ML (10ML) SYRINGE FOR IV PUSH (FOR BLOOD PRESSURE SUPPORT): 80.0000 ug | PREFILLED_SYRINGE | INTRAVENOUS | Status: DC | PRN

## 2019-09-06 MED ORDER — TETANUS-DIPHTH-ACELL PERTUSSIS 5-2.5-18.5 LF-MCG/0.5 IM SUSP: 0.5000 mL | Freq: Once | INTRAMUSCULAR | Status: DC

## 2019-09-06 MED ORDER — OXYCODONE HCL 5 MG PO TABS: 5.0000 mg | ORAL_TABLET | ORAL | Status: DC | PRN

## 2019-09-06 MED ORDER — PRENATAL MULTIVITAMIN CH
1.0000 | ORAL_TABLET | Freq: Every day | ORAL | Status: DC
  Administered 2019-09-07: 1 via ORAL
  Filled 2019-09-06: qty 1

## 2019-09-06 MED ORDER — DIPHENHYDRAMINE HCL 50 MG/ML IJ SOLN: 12.5000 mg | INTRAMUSCULAR | Status: DC | PRN

## 2019-09-06 MED ORDER — FENTANYL-BUPIVACAINE-NACL 0.5-0.125-0.9 MG/250ML-% EP SOLN: 12.0000 mL/h | EPIDURAL | Status: DC | PRN

## 2019-09-06 MED ORDER — PHENYLEPHRINE 40 MCG/ML (10ML) SYRINGE FOR IV PUSH (FOR BLOOD PRESSURE SUPPORT)
80.0000 ug | PREFILLED_SYRINGE | INTRAVENOUS | Status: AC | PRN
  Administered 2019-09-06 (×3): 80 ug via INTRAVENOUS

## 2019-09-06 MED ORDER — EPHEDRINE 5 MG/ML INJ
10.0000 mg | INTRAVENOUS | Status: DC | PRN
  Filled 2019-09-06: qty 10

## 2019-09-06 MED ORDER — COCONUT OIL OIL: 1.0000 "application " | TOPICAL_OIL | Status: DC | PRN

## 2019-09-06 MED ORDER — FENTANYL-BUPIVACAINE-NACL 0.5-0.125-0.9 MG/250ML-% EP SOLN
EPIDURAL | Status: AC
  Filled 2019-09-06: qty 250

## 2019-09-06 MED ORDER — ONDANSETRON HCL 4 MG/2ML IJ SOLN: 4.0000 mg | INTRAMUSCULAR | Status: DC | PRN

## 2019-09-06 MED ORDER — SODIUM CHLORIDE (PF) 0.9 % IJ SOLN
INTRAMUSCULAR | Status: DC | PRN
  Administered 2019-09-06: 12 mL/h via EPIDURAL

## 2019-09-06 MED ORDER — ACETAMINOPHEN 325 MG PO TABS: 650.0000 mg | ORAL_TABLET | ORAL | Status: DC | PRN

## 2019-09-06 MED ORDER — PHENYLEPHRINE 40 MCG/ML (10ML) SYRINGE FOR IV PUSH (FOR BLOOD PRESSURE SUPPORT)
PREFILLED_SYRINGE | INTRAVENOUS | Status: AC
  Filled 2019-09-06: qty 10

## 2019-09-06 MED ORDER — SENNOSIDES-DOCUSATE SODIUM 8.6-50 MG PO TABS: 2.0000 | ORAL_TABLET | ORAL | Status: DC

## 2019-09-06 MED ORDER — LIDOCAINE HCL (PF) 1 % IJ SOLN
INTRAMUSCULAR | Status: DC | PRN
  Administered 2019-09-06: 5 mL via EPIDURAL

## 2019-09-06 MED ORDER — EPHEDRINE 5 MG/ML INJ: 10.0000 mg | INTRAVENOUS | Status: DC | PRN

## 2019-09-06 MED ORDER — ONDANSETRON HCL 4 MG/2ML IJ SOLN: 4.0000 mg | Freq: Four times a day (QID) | INTRAMUSCULAR | Status: DC | PRN

## 2019-09-06 MED ORDER — EPHEDRINE 5 MG/ML INJ
10.0000 mg | INTRAVENOUS | Status: DC | PRN
  Administered 2019-09-06: 10 mg via INTRAVENOUS

## 2019-09-06 MED ORDER — DIPHENHYDRAMINE HCL 25 MG PO CAPS: 25.0000 mg | ORAL_CAPSULE | Freq: Four times a day (QID) | ORAL | Status: DC | PRN

## 2019-09-06 MED ORDER — ZOLPIDEM TARTRATE 5 MG PO TABS: 5.0000 mg | ORAL_TABLET | Freq: Every evening | ORAL | Status: DC | PRN

## 2019-09-06 MED ORDER — WITCH HAZEL-GLYCERIN EX PADS: 1.0000 "application " | MEDICATED_PAD | CUTANEOUS | Status: DC | PRN

## 2019-09-06 MED ORDER — OXYCODONE-ACETAMINOPHEN 5-325 MG PO TABS: 1.0000 | ORAL_TABLET | ORAL | Status: DC | PRN

## 2019-09-06 MED ORDER — OXYTOCIN 40 UNITS IN NORMAL SALINE INFUSION - SIMPLE MED
2.5000 [IU]/h | INTRAVENOUS | Status: DC
  Filled 2019-09-06: qty 1000

## 2019-09-06 NOTE — H&P (Signed)
Amy Morse is a 33 y.o.G43P1001 female presenting with complaint of leakage of fluid. Pt thinks had some leaking for the past two days but on arrival to MAU she had a large gush of clear fluid. She denies fever. Pt was 3cm in office two days ago and was 5cm on arrival - has progressed quickly spontaneously since SROM to 9.5cm dilation now. She has had a benign prenatal course. Pregnancy is dated by LMP ; confirmed with a 7week [place]. She  Measured s<d but scan with MFM wnl. GBS is neg  OB History    Gravida  2   Para  1   Term  1   Preterm      AB      Living  1     SAB      TAB      Ectopic      Multiple      Live Births  1          Past Medical History:  Diagnosis Date  . Medical history non-contributory    Past Surgical History:  Procedure Laterality Date  . NO PAST SURGERIES     Family History: family history includes Depression in her mother; Diabetes in her father; Heart disease in her father and mother; Hypertension in her father. Social History:  reports that she has never smoked. She has never used smokeless tobacco. She reports that she does not drink alcohol or use drugs.     Maternal Diabetes: No Genetic Screening: Declined Maternal Ultrasounds/Referrals: Normal Fetal Ultrasounds or other Referrals:  None Maternal Substance Abuse:  No Significant Maternal Medications:  None Significant Maternal Lab Results:  Group B Strep negative Other Comments:  None  Review of Systems  Constitutional: Negative for chills, fever, malaise/fatigue and weight loss.  Eyes: Negative for blurred vision, double vision and photophobia.  Respiratory: Negative for shortness of breath.   Cardiovascular: Negative for chest pain.  Gastrointestinal: Negative for abdominal pain, heartburn, nausea and vomiting.  Musculoskeletal: Negative for back pain.  Skin: Negative for itching and rash.  Neurological: Negative for dizziness and headaches.  Endo/Heme/Allergies: Negative for  environmental allergies. Does not bruise/bleed easily.  Psychiatric/Behavioral: Negative for depression, hallucinations, substance abuse and suicidal ideas. The patient is not nervous/anxious.    Maternal Medical History:  Reason for admission: Rupture of membranes.  Nausea.  Contractions: Onset was 3-5 hours ago.   Frequency: regular.   Perceived severity is moderate.    Fetal activity: Perceived fetal activity is normal.   Last perceived fetal movement was within the past hour.    Prenatal complications: no prenatal complications Prenatal Complications - Diabetes: none.    Dilation: Lip/rim Effacement (%): 100 Station: 0 Exam by:: Nathasha Fiorillo Blood pressure 119/76, pulse 82, temperature 98.5 F (36.9 C), temperature source Oral, resp. rate 18, height 5\' 1"  (1.549 m), weight 64.4 kg, last menstrual period 12/08/2018, SpO2 99 %, unknown if currently breastfeeding. Maternal Exam:  Uterine Assessment: Contraction strength is moderate.  Contraction frequency is regular.   Abdomen: Patient reports generalized tenderness.  Estimated fetal weight is AGA.   Fetal presentation: vertex  Introitus: Normal vulva. Vulva is negative for condylomata and lesion.  Normal vagina.  Vagina is negative for condylomata.  Nitrazine test: positive. Amniotic fluid character: clear.  Pelvis: adequate for delivery.   Cervix: Cervix evaluated by digital exam.     Fetal Exam Fetal Monitor Review: Baseline rate: 140.  Pattern: accelerations present, variable decelerations and early decelerations.  Fetal State Assessment: Category I - tracings are normal.     Physical Exam  Constitutional: She is oriented to person, place, and time. She appears well-developed and well-nourished.  Neck: Normal range of motion.  Cardiovascular: Normal rate.  Respiratory: Effort normal.  GI: Soft. There is generalized abdominal tenderness.  Genitourinary:    Vulva, vagina and uterus normal.     No vulval  condylomata or lesion noted.   Musculoskeletal: Normal range of motion.  Neurological: She is alert and oriented to person, place, and time.  Skin: Skin is warm.  Psychiatric: She has a normal mood and affect. Her behavior is normal. Judgment and thought content normal.    Prenatal labs: ABO, Rh: --/--/O POS, O POS Performed at [facility], [address], [place], [place] [address]  [phone]) Antibody: NEG (09/30 1035) Rubella:   RPR:    HBsAg:    HIV:    GBS:     Assessment/Plan: 33yo G2P1001 at 38 6/7wks in active labor, now comfortable with epidural GBS neg COVID SARs neg Anticipate svd    Odin Mariani W Elyzabeth Goatley 09/06/2019, 1:02 PM

## 2019-09-06 NOTE — Anesthesia Preprocedure Evaluation (Addendum)
Anesthesia Evaluation  Patient identified by MRN, date of birth, ID band Patient awake    Reviewed: Allergy & Precautions, NPO status , Patient's Chart, lab work & pertinent test results  Airway Mallampati: I       Dental no notable dental hx.    Pulmonary neg pulmonary ROS,    Pulmonary exam normal        Cardiovascular negative cardio ROS Normal cardiovascular exam     Neuro/Psych negative neurological ROS  negative psych ROS   GI/Hepatic Neg liver ROS,   Endo/Other    Renal/GU      Musculoskeletal   Abdominal   Peds  Hematology negative hematology ROS (+)   Anesthesia Other Findings   Reproductive/Obstetrics (+) Pregnancy                            Anesthesia Physical Anesthesia Plan  ASA: II  Anesthesia Plan: Epidural   Post-op Pain Management:    Induction:   PONV Risk Score and Plan: Treatment may vary due to age or medical condition  Airway Management Planned:   Additional Equipment: None  Intra-op Plan:   Post-operative Plan:   Informed Consent: I have reviewed the patients History and Physical, chart, labs and discussed the procedure including the risks, benefits and alternatives for the proposed anesthesia with the patient or authorized representative who has indicated his/her understanding and acceptance.       Plan Discussed with:   Anesthesia Plan Comments: (Lab Results      Component                Value               Date                      WBC                      13.2 (H)            09/06/2019                HGB                      12.8                09/06/2019                HCT                      38.1                09/06/2019                MCV                      96.2                09/06/2019                PLT                      202                 09/06/2019            COVID-19 Labs  No results for input(s): DDIMER, FERRITIN, LDH,  CRP in the last  72 hours.  Lab Results      Component                Value               Date                      [id number]              NEGATIVE            09/06/2019            )        Anesthesia Quick Evaluation

## 2019-09-06 NOTE — Lactation Note (Signed)
This note was copied from a baby's chart. Lactation Consultation Note  Patient Name: Amy Morse Date: 09/06/2019 Reason for consult: Follow-up assessment;Initial assessment   Mom bf her 31 year old 5 months.  BF and formula fed early in bf.  Mom felt the continuous feeding was too much on her.    Infant swaddled next to mom in bed.  LC offered assist with feed.  Mom was concerned infant wasn't feeding enough.  LC reviewed bf basics with mom and dad.  Offered assistance with hand expression.  Mom returned demo of hand exp and drops were seen.     Infant placed STS. Mom prefers cradle hold.  She doesn't like football; tried with her last baby and this infant. LC encouraged cross cradle for head support.  After several attempts, infant did achieve latch in cradle hold, mom felt some pinching.  LC offered to assist with positioning and latch, and dad was taught how to move infant to breast when gape was wide.  Infant immediately began into a sucking pattern with deep jaw movements seen.  [place] praised mom and baby and dad.  Mom used compression and massage and infant swallow was heard.  [facility] taught mom to listen for these and use compression during feeds.  Infant fell asleep after 5 minutes then was latched successfully again.  Parents will keep time for feed and let RN know time.    Brochure given providing LC number and OP LC appt.. number.     Maternal Data Has patient been taught Hand Expression?: Yes Does the patient have breastfeeding experience prior to this delivery?: Yes  Feeding Feeding Type: Breast Fed  LATCH Score Latch: Repeated attempts needed to sustain latch, nipple held in mouth throughout feeding, stimulation needed to elicit sucking reflex.  Audible Swallowing: A few with stimulation  Type of Nipple: Everted at rest and after stimulation  Comfort (Breast/Nipple): Soft / non-tender  Hold (Positioning): Assistance needed to correctly position infant at breast  and maintain latch.  LATCH Score: 7  Interventions Interventions: Breast feeding basics reviewed;Assisted with latch;Support pillows;Position options;Skin to skin;Breast massage;Hand express;Adjust position;Breast compression  Lactation Tools Discussed/Used     Consult Status Consult Status: Follow-up Date: 09/07/19 Follow-up type: In-patient    Ferne Coe [facility] 09/06/2019, 8:12 PM

## 2019-09-06 NOTE — Anesthesia Postprocedure Evaluation (Signed)
Anesthesia Post Note  Patient: Amy Morse  Procedure(s) Performed: AN AD HOC LABOR EPIDURAL     Patient location during evaluation: Mother Baby Anesthesia Type: Epidural Level of consciousness: awake and alert Pain management: pain level controlled Vital Signs Assessment: post-procedure vital signs reviewed and stable Respiratory status: spontaneous breathing, nonlabored ventilation and respiratory function stable Cardiovascular status: stable Postop Assessment: no headache, no backache and epidural receding Anesthetic complications: no    Last Vitals:  Vitals:   09/06/19 1530 09/06/19 1630  BP: 106/66 108/64  Pulse: 66 71  Resp: 18 18  Temp: 36.7 C 36.9 C  SpO2:      Last Pain:  Vitals:   09/06/19 1630  TempSrc: Oral  PainSc: 0-No pain   Pain Goal:                   Leva Baine

## 2019-09-06 NOTE — MAU Note (Signed)
Patient has c/o leaking of fluid since Monday night. This am patient had a larger gush of fluid with pink vaginal discharge. Patient reports fetal movement.

## 2019-09-06 NOTE — Anesthesia Procedure Notes (Signed)
Epidural Patient location during procedure: OB Start time: 09/06/2019 11:37 AM End time: 09/06/2019 11:43 AM  Staffing Anesthesiologist: Effie Berkshire, MD Performed: anesthesiologist   Preanesthetic Checklist Completed: patient identified, site marked, surgical consent, pre-op evaluation, timeout performed, IV checked, risks and benefits discussed and monitors and equipment checked  Epidural Patient position: sitting Prep: ChloraPrep Patient monitoring: heart rate, continuous pulse ox and blood pressure Approach: midline Location: L3-L4 Injection technique: LOR saline  Needle:  Needle type: Tuohy  Needle gauge: 17 G Needle length: 9 cm Catheter type: closed end flexible Catheter size: 20 Guage Test dose: negative and 1.5% lidocaine  Assessment Events: blood not aspirated, injection not painful, no injection resistance and no paresthesia  Additional Notes LOR @ 4  Patient identified. Risks/Benefits/Options discussed with patient including but not limited to bleeding, infection, nerve damage, paralysis, failed block, incomplete pain control, headache, blood pressure changes, nausea, vomiting, reactions to medications, itching and postpartum back pain. Confirmed with bedside nurse the patient's most recent platelet count. Confirmed with patient that they are not currently taking any anticoagulation, have any bleeding history or any family history of bleeding disorders. Patient expressed understanding and wished to proceed. All questions were answered. Sterile technique was used throughout the entire procedure. Please see nursing notes for vital signs. Test dose was given through epidural catheter and negative prior to continuing to dose epidural or start infusion. Warning signs of high block given to the patient including shortness of breath, tingling/numbness in hands, complete motor block, or any concerning symptoms with instructions to call for help. Patient was given instructions  on fall risk and not to get out of bed. All questions and concerns addressed with instructions to call with any issues or inadequate analgesia.    Reason for block:procedure for pain

## 2019-09-07 LAB — RPR: RPR Ser Ql: NONREACTIVE

## 2019-09-07 LAB — CBC
HCT: 34.7 % — ABNORMAL LOW (ref 36.0–46.0)
Hemoglobin: 11.7 g/dL — ABNORMAL LOW (ref 12.0–15.0)
MCH: 32.3 pg (ref 26.0–34.0)
MCHC: 33.7 g/dL (ref 30.0–36.0)
MCV: 95.9 fL (ref 80.0–100.0)
Platelets: 185 10*3/uL (ref 150–400)
RBC: 3.62 MIL/uL — ABNORMAL LOW (ref 3.87–5.11)
RDW: 13.5 % (ref 11.5–15.5)
WBC: 14.1 10*3/uL — ABNORMAL HIGH (ref 4.0–10.5)
nRBC: 0 % (ref 0.0–0.2)

## 2019-09-07 MED ORDER — ACETAMINOPHEN 325 MG PO TABS: 650.0000 mg | ORAL_TABLET | ORAL | 0 refills | Status: AC | PRN

## 2019-09-07 MED ORDER — IBUPROFEN 600 MG PO TABS: 600.0000 mg | ORAL_TABLET | Freq: Four times a day (QID) | ORAL | 0 refills | Status: AC

## 2019-09-07 NOTE — Discharge Summary (Signed)
OB Discharge Summary     Patient Name: Amy Morse DOB: 03/14/1986 MRN: [id number]  Date of admission: 09/06/2019 Delivering MD: [facility]   Date of discharge: 09/07/2019  Admitting diagnosis: [place] CTX Intrauterine pregnancy: [redacted]w[redacted]d     Secondary diagnosis:  Active Problems:   Normal labor   Vaginal delivery  Additional problems: none     Discharge diagnosis: Term Pregnancy Delivered                                                                                                Post partum procedures:none  Complications: None  Hospital course:  Onset of Labor With Vaginal Delivery     33 y.o. yo [id number] at [redacted]w[redacted]d was admitted in Active Labor on 09/06/2019. Patient had an uncomplicated labor course as follows:  Membrane Rupture Time/Date: 9:00 AM ,09/06/2019   Intrapartum Procedures: Episiotomy: None [1]                                         Lacerations:  Periurethral [8]  Patient had a delivery of a Viable infant. 09/06/2019  Information for the patient's newborn:  Niesha, Bame [[id number]]  Delivery Method: Vaginal, Spontaneous(Filed from Delivery Summary)     Pateint had an uncomplicated postpartum course.  She is ambulating, tolerating a regular diet, passing flatus, and urinating well. Patient is discharged home in stable condition on 09/07/19.   Physical exam  Vitals:   09/06/19 1630 09/06/19 2044 09/07/19 0013 09/07/19 0519  BP: 108/64 (!) 93/59 106/73 (!) 94/57  Pulse: 71 71 63 76  Resp: 18 17 16 18   Temp: 98.4 F (36.9 C) 97.8 F (36.6 C) 98 F (36.7 C) (!) 97.5 F (36.4 C)  TempSrc: Oral Oral Oral Oral  SpO2:  99% 100% 99%  Weight:      Height:       General: alert and cooperative Lochia: appropriate Uterine Fundus: firm  Lab Results  Component Value Date   WBC 14.1 (H) 09/07/2019   HGB 11.7 (L) 09/07/2019   HCT 34.7 (L) 09/07/2019   MCV 95.9 09/07/2019   PLT 185 09/07/2019   No flowsheet data found.  Discharge  instruction: per After Visit Summary and "Baby and Me Booklet".  After visit meds:  Allergies as of 09/07/2019   No Known Allergies     Medication List    TAKE these medications   acetaminophen 325 MG tablet Commonly known as: Tylenol Take 2 tablets (650 mg total) by mouth every 4 (four) hours as needed (for pain scale < 4).   ibuprofen 600 MG tablet Commonly known as: ADVIL Take 1 tablet (600 mg total) by mouth every 6 (six) hours.   prenatal multivitamin Tabs tablet Take 1 tablet by mouth daily at 12 noon.       Diet: routine diet  Activity: Advance as tolerated. Pelvic rest for 6 weeks.   Outpatient follow up:6 weeks Follow up Appt:No future appointments. Follow up Visit:No follow-ups on file.  Postpartum contraception: Condoms  Newborn Data: Live born female  Birth Weight: 6 lb 12.3 oz (3070 g) APGAR: 8, 9  Newborn Delivery   Birth date/time: 09/06/2019 13:35:00 Delivery type: Vaginal, Spontaneous      Baby Feeding: Bottle and Breast Disposition:home with mother  Decline circumcision 09/07/2019 Logan Bores, MD

## 2019-09-07 NOTE — Progress Notes (Signed)
Post Partum Day 1 Subjective: no complaints, up ad lib and tolerating PO  Requesting early discharge if baby able to go  Objective: Blood pressure (!) 94/57, pulse 76, temperature (!) 97.5 F (36.4 C), temperature source Oral, resp. rate 18, height 5\' 1"  (1.549 m), weight 64.4 kg, last menstrual period 12/08/2018, SpO2 99 %, unknown if currently breastfeeding.  Physical Exam:  General: alert and cooperative Lochia: appropriate Uterine Fundus: firm   Recent Labs    09/06/19 1051 09/07/19 0550  HGB 12.8 11.7*  HCT 38.1 34.7*    Assessment/Plan: Discharge home if baby able to go   LOS: 1 day   Logan Bores 09/07/2019, 9:32 AM

## 2019-09-08 ENCOUNTER — Inpatient Hospital Stay (HOSPITAL_COMMUNITY)
Admission: AD | Admit: 2019-09-08 | Payer: BC Managed Care – PPO | Source: Home / Self Care | Admitting: Obstetrics and Gynecology

## 2019-09-08 ENCOUNTER — Inpatient Hospital Stay (HOSPITAL_COMMUNITY): Payer: BC Managed Care – PPO

## 2019-11-09 IMAGING — US US OB < 14 WEEKS - US OB TV
1 series · 15 of 28 positions shown · non-contrast
Comparison: None available.

CLINICAL DATA: Initial evaluation for threatened miscarriage.

EXAM:
OBSTETRIC <14 WK US AND TRANSVAGINAL OB US
TECHNIQUE: Both transabdominal and transvaginal ultrasound examinations were
performed for complete evaluation of the gestation as well as the
maternal uterus, adnexal regions, and pelvic cul-de-sac.
Transvaginal technique was performed to assess early pregnancy.

[Series 1: us ob < 14 weeks - us ob tv · 15 of 41 slices shown]
[im 1/41]
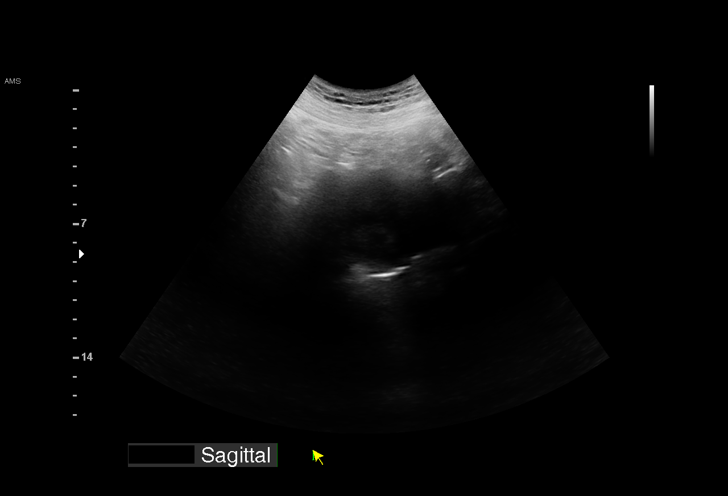
[im 3/41]
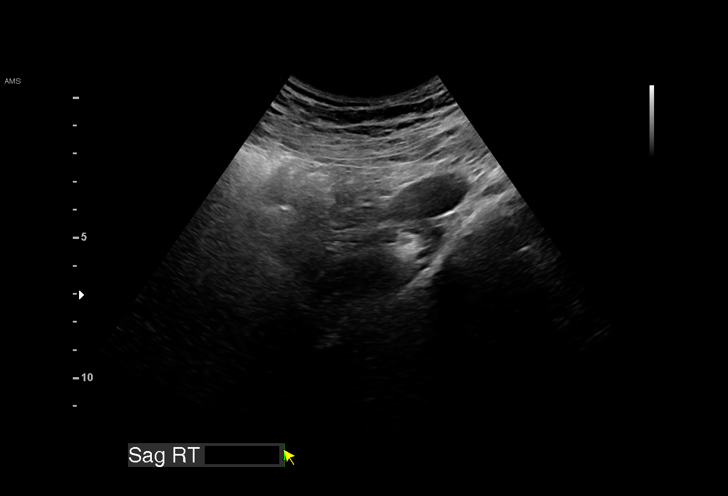
[im 6/41]
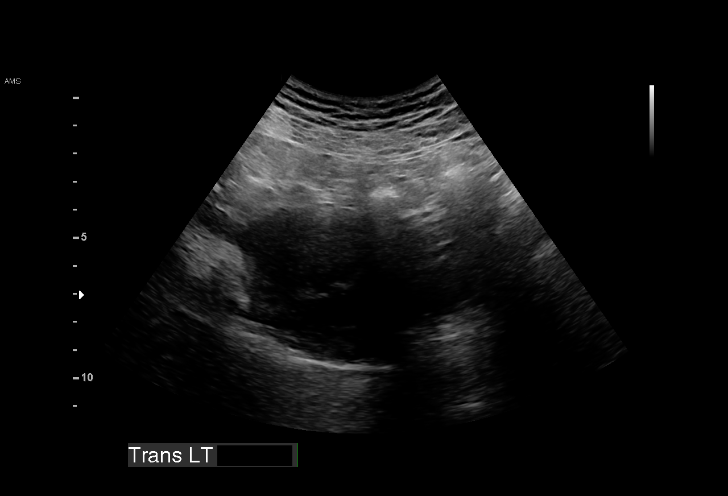
[im 9/41]
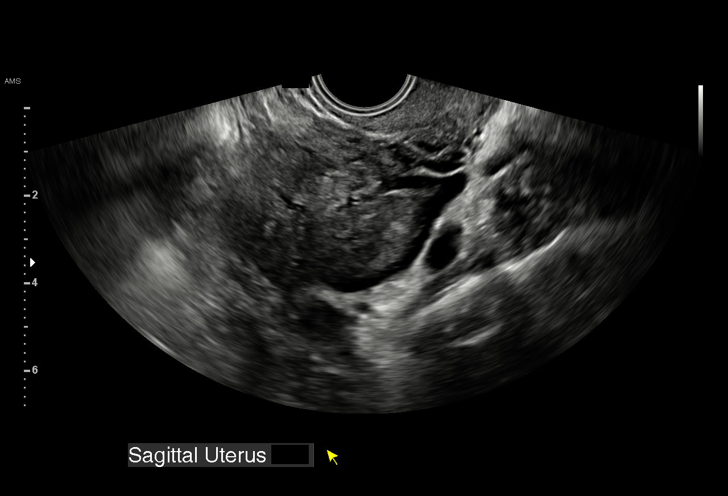
[im 12/41]
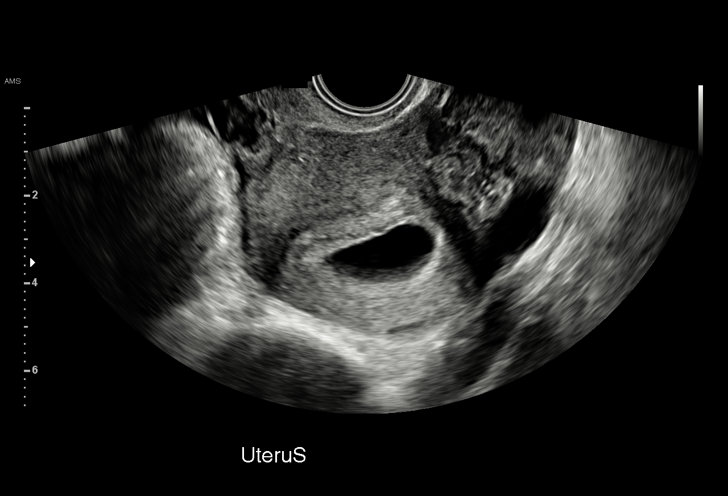
[im 15/41]
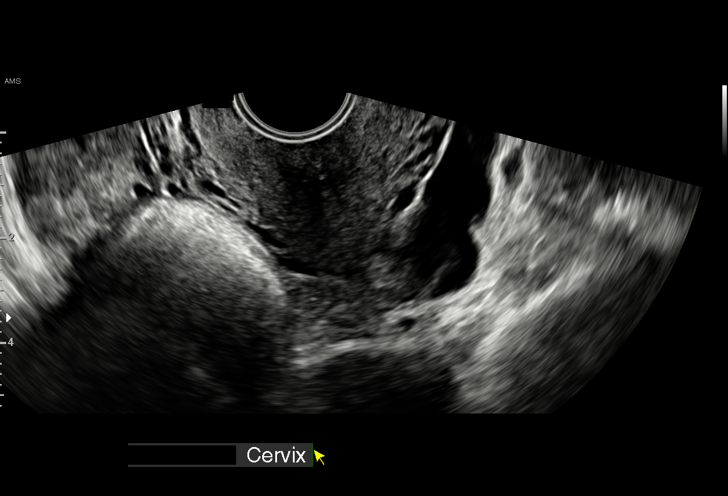
[im 18/41]
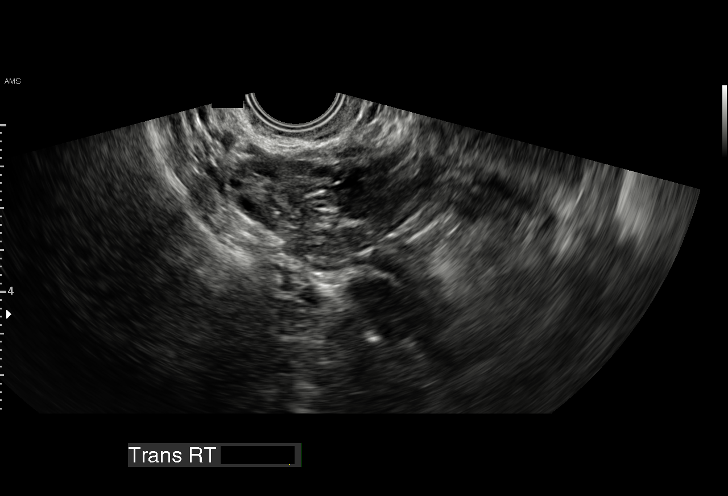
[im 21/41]
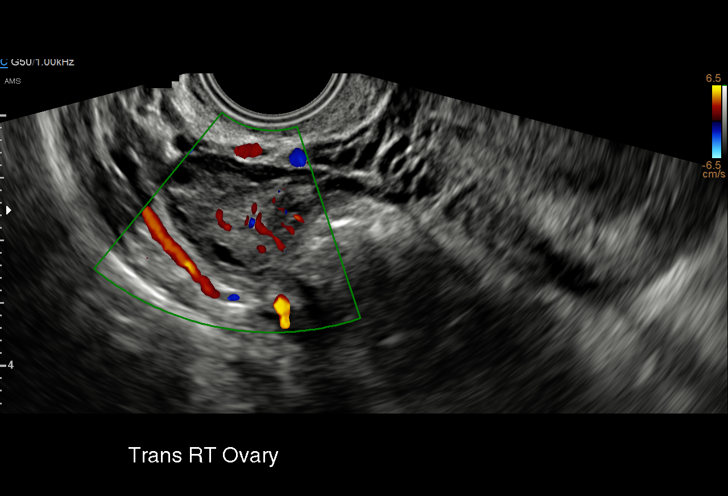
[im 23/41]
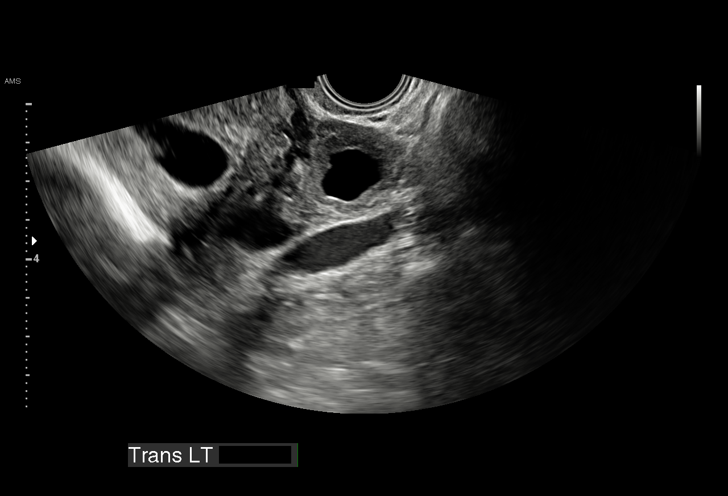
[im 26/41]
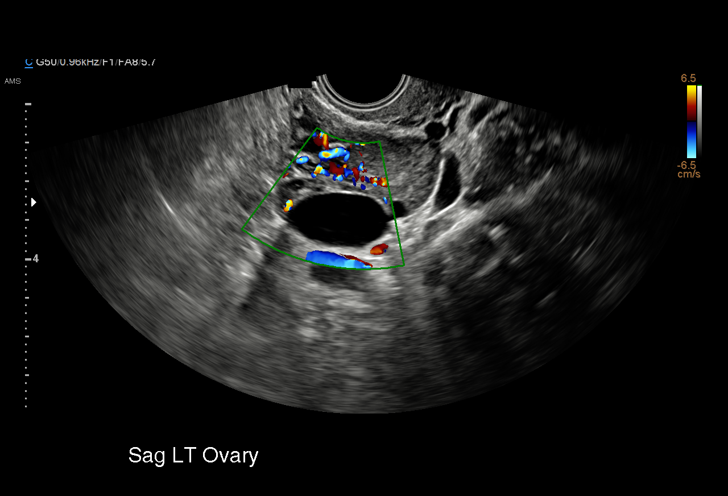
[im 29/41]
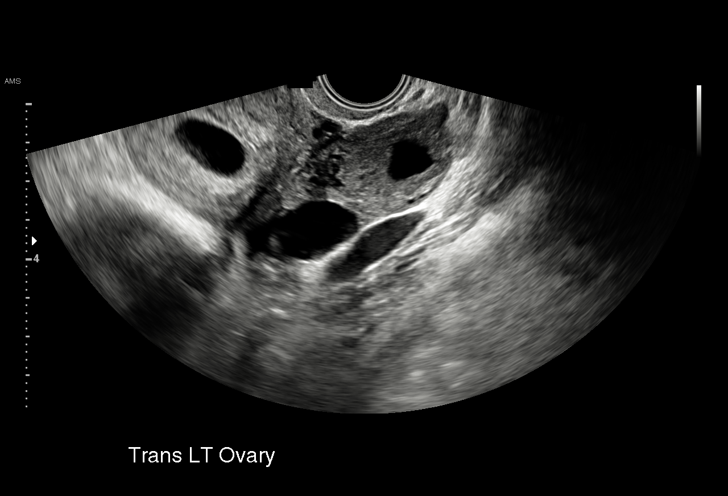
[im 32/41]
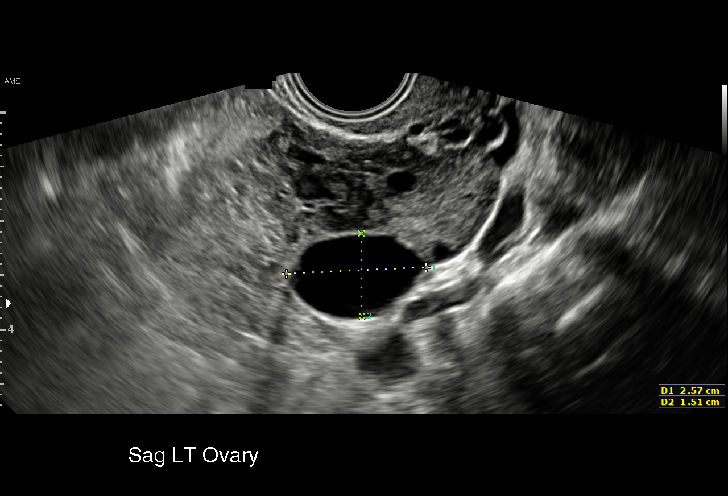
[im 35/41]
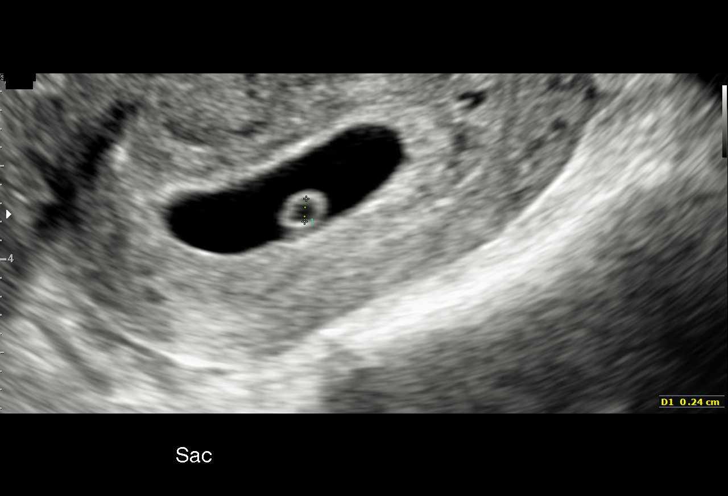
[im 38/41]
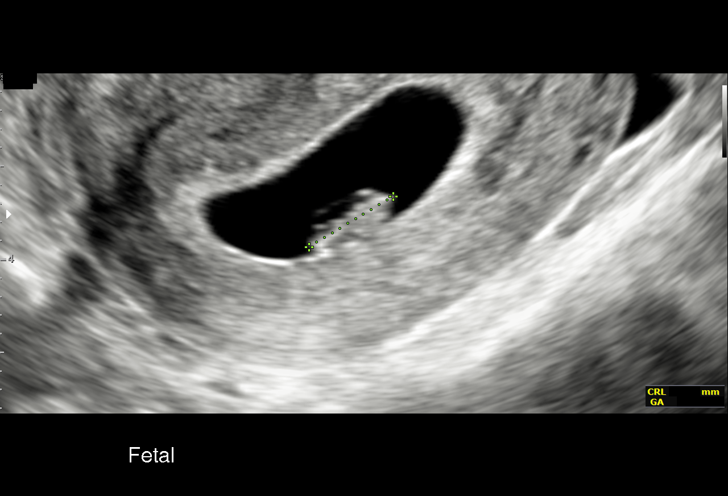
[im 41/41]
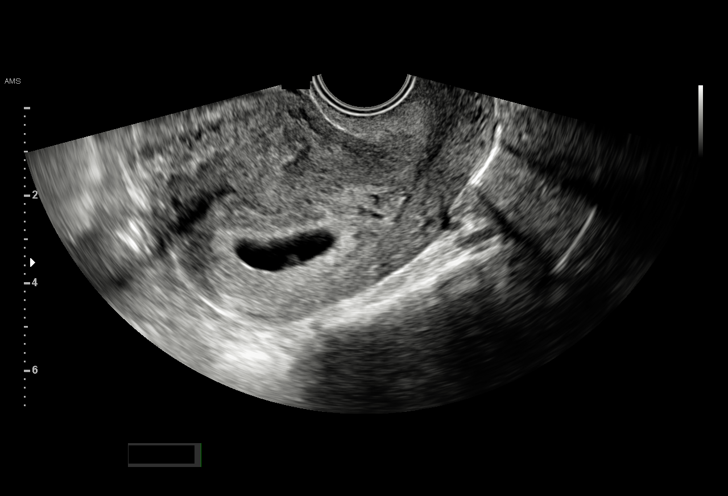

[15 of 28 positions shown; findings below may reference images not displayed]

FINDINGS: Intrauterine gestational sac: Single, positioned at the uterine
fundus.

Yolk sac:  Present

Embryo:  Present

Cardiac Activity: Present

Heart Rate: 140 bpm

CRL: 10.52 mm   7 w   1 d                  US EDC: 09/14/2019

Subchorionic hemorrhage:  None visualized.

Maternal uterus/adnexae: Right ovary unremarkable and normal in
appearance. Physiologic corpus luteal cyst noted within the left
ovary. Adjacent well-circumscribed simple cyst measuring 2.6 x 1.5 x
2.6 cm, likely a partial exophytic simple physiologic follicular
cyst or possibly paraovarian cyst. No internal complexity or
vascularity. Small volume free fluid within the pelvis.
IMPRESSION: 1. Single viable intrauterine pregnancy as above, estimated
gestational age 7 weeks and 1 day based on crown-rump length, with
ultrasound EDC of 09/14/2019.
2. Left ovarian corpus luteal cyst with associated small volume free
fluid within the pelvis.
3. Additional 2.6 cm simple left adnexal cyst, which could reflect
an exophytic simple physiologic follicular cyst versus paraovarian
cyst.
